# Patient Record
Sex: Male | Born: 1970 | Race: White | Hispanic: No | Marital: Married
Health system: Southern US, Community
[De-identification: ages and names within clinical notes are randomized; demographics above are authoritative.]

---

## 2014-04-19 ENCOUNTER — Ambulatory Visit
Admission: RE | Admit: 2014-04-19 | Discharge: 2014-04-19 | Disposition: A | Discharge status: Home / Self Care | Payer: 59 | Source: Ambulatory Visit | Attending: Family Medicine | Admitting: Family Medicine

## 2014-04-19 ENCOUNTER — Other Ambulatory Visit: Payer: Self-pay | Admitting: Family Medicine

## 2014-04-19 DIAGNOSIS — M25529 Pain in unspecified elbow: Secondary | ICD-10-CM

## 2017-04-08 DIAGNOSIS — H6123 Impacted cerumen, bilateral: Secondary | ICD-10-CM | POA: Diagnosis not present

## 2017-04-08 DIAGNOSIS — I10 Essential (primary) hypertension: Secondary | ICD-10-CM | POA: Diagnosis not present

## 2017-10-23 DIAGNOSIS — I10 Essential (primary) hypertension: Secondary | ICD-10-CM | POA: Diagnosis not present

## 2017-10-23 DIAGNOSIS — H6123 Impacted cerumen, bilateral: Secondary | ICD-10-CM | POA: Diagnosis not present

## 2017-10-23 DIAGNOSIS — Z23 Encounter for immunization: Secondary | ICD-10-CM | POA: Diagnosis not present

## 2017-10-23 DIAGNOSIS — Z0000 Encounter for general adult medical examination without abnormal findings: Secondary | ICD-10-CM | POA: Diagnosis not present

## 2018-04-23 DIAGNOSIS — H6123 Impacted cerumen, bilateral: Secondary | ICD-10-CM | POA: Diagnosis not present

## 2018-04-23 DIAGNOSIS — I10 Essential (primary) hypertension: Secondary | ICD-10-CM | POA: Diagnosis not present

## 2018-06-02 DIAGNOSIS — L814 Other melanin hyperpigmentation: Secondary | ICD-10-CM | POA: Diagnosis not present

## 2018-06-02 DIAGNOSIS — D225 Melanocytic nevi of trunk: Secondary | ICD-10-CM | POA: Diagnosis not present

## 2018-06-02 DIAGNOSIS — L738 Other specified follicular disorders: Secondary | ICD-10-CM | POA: Diagnosis not present

## 2018-10-23 DIAGNOSIS — Z23 Encounter for immunization: Secondary | ICD-10-CM | POA: Diagnosis not present

## 2018-10-23 DIAGNOSIS — I10 Essential (primary) hypertension: Secondary | ICD-10-CM | POA: Diagnosis not present

## 2018-10-23 DIAGNOSIS — Z0000 Encounter for general adult medical examination without abnormal findings: Secondary | ICD-10-CM | POA: Diagnosis not present

## 2019-05-11 DIAGNOSIS — H6123 Impacted cerumen, bilateral: Secondary | ICD-10-CM | POA: Diagnosis not present

## 2019-05-11 DIAGNOSIS — I10 Essential (primary) hypertension: Secondary | ICD-10-CM | POA: Diagnosis not present

## 2019-10-28 DIAGNOSIS — Z0000 Encounter for general adult medical examination without abnormal findings: Secondary | ICD-10-CM | POA: Diagnosis not present

## 2019-10-28 DIAGNOSIS — I10 Essential (primary) hypertension: Secondary | ICD-10-CM | POA: Diagnosis not present

## 2019-10-28 DIAGNOSIS — Z125 Encounter for screening for malignant neoplasm of prostate: Secondary | ICD-10-CM | POA: Diagnosis not present

## 2019-10-28 DIAGNOSIS — Z23 Encounter for immunization: Secondary | ICD-10-CM | POA: Diagnosis not present

## 2019-12-07 DIAGNOSIS — S91311A Laceration without foreign body, right foot, initial encounter: Secondary | ICD-10-CM | POA: Diagnosis not present

## 2019-12-28 DIAGNOSIS — S91111A Laceration without foreign body of right great toe without damage to nail, initial encounter: Secondary | ICD-10-CM | POA: Diagnosis not present

## 2020-04-27 DIAGNOSIS — I10 Essential (primary) hypertension: Secondary | ICD-10-CM | POA: Diagnosis not present

## 2020-04-27 DIAGNOSIS — H6123 Impacted cerumen, bilateral: Secondary | ICD-10-CM | POA: Diagnosis not present

## 2020-10-31 DIAGNOSIS — Z125 Encounter for screening for malignant neoplasm of prostate: Secondary | ICD-10-CM | POA: Diagnosis not present

## 2020-10-31 DIAGNOSIS — Z23 Encounter for immunization: Secondary | ICD-10-CM | POA: Diagnosis not present

## 2020-10-31 DIAGNOSIS — I10 Essential (primary) hypertension: Secondary | ICD-10-CM | POA: Diagnosis not present

## 2020-10-31 DIAGNOSIS — Z0000 Encounter for general adult medical examination without abnormal findings: Secondary | ICD-10-CM | POA: Diagnosis not present

## 2020-10-31 DIAGNOSIS — Z13220 Encounter for screening for lipoid disorders: Secondary | ICD-10-CM | POA: Diagnosis not present

## 2020-12-06 DIAGNOSIS — E669 Obesity, unspecified: Secondary | ICD-10-CM | POA: Diagnosis not present

## 2020-12-06 DIAGNOSIS — R748 Abnormal levels of other serum enzymes: Secondary | ICD-10-CM | POA: Diagnosis not present

## 2020-12-06 DIAGNOSIS — Z1211 Encounter for screening for malignant neoplasm of colon: Secondary | ICD-10-CM | POA: Diagnosis not present

## 2021-01-27 DIAGNOSIS — D123 Benign neoplasm of transverse colon: Secondary | ICD-10-CM | POA: Diagnosis not present

## 2021-01-27 DIAGNOSIS — K635 Polyp of colon: Secondary | ICD-10-CM | POA: Diagnosis not present

## 2021-01-27 DIAGNOSIS — Z1211 Encounter for screening for malignant neoplasm of colon: Secondary | ICD-10-CM | POA: Diagnosis not present

## 2021-05-11 DIAGNOSIS — H6123 Impacted cerumen, bilateral: Secondary | ICD-10-CM | POA: Diagnosis not present

## 2021-05-11 DIAGNOSIS — R945 Abnormal results of liver function studies: Secondary | ICD-10-CM | POA: Diagnosis not present

## 2021-05-11 DIAGNOSIS — I10 Essential (primary) hypertension: Secondary | ICD-10-CM | POA: Diagnosis not present

## 2021-05-18 ENCOUNTER — Other Ambulatory Visit: Payer: Self-pay

## 2021-05-18 ENCOUNTER — Ambulatory Visit: Payer: BC Managed Care – PPO | Admitting: Podiatry

## 2021-05-18 ENCOUNTER — Encounter: Payer: Self-pay | Admitting: Podiatry

## 2021-05-18 DIAGNOSIS — B070 Plantar wart: Secondary | ICD-10-CM

## 2021-05-18 NOTE — Progress Notes (Signed)
Subjective:   Patient ID: George Hanson, male   DOB: 51 y.o.   MRN: [id number]   HPI Patient presents stating he is got a lesion underneath his right foot that is been there for fairly long time and concerned about 1 on the left.  States he thinks it is a plantars wart and did have history when he was a child.  Patient does not smoke likes to be active   Review of Systems  All other systems reviewed and are negative.       Objective:  Physical Exam Vitals and nursing note reviewed.  Constitutional:      Appearance: He is well-developed.  Pulmonary:     Effort: Pulmonary effort is normal.  Musculoskeletal:        General: Normal range of motion.  Skin:    General: Skin is warm.  Neurological:     Mental Status: He is alert.     Neurovascular status intact muscle strength adequate range of motion adequate patient found to have keratotic lesions of first metatarsal shaft right that upon debridement shows pinpoint bleeding.  Patient is found to have small 1 on the left but does not appear to be the same consistency as the right 1 and the right one measures approximately 4 x 4 millimeter     Assessment:  Probability for verruca plantaris plantar right     Plan:  H&P reviewed condition debrided the lesion saw small amount of pinpoint bleeding consistent with wart and applied chemical to create immune response with sterile dressing.  Reappoint to recheck again and may require excision but I like to avoid that if possible

## 2021-08-17 DIAGNOSIS — M1712 Unilateral primary osteoarthritis, left knee: Secondary | ICD-10-CM | POA: Diagnosis not present

## 2021-11-17 DIAGNOSIS — R945 Abnormal results of liver function studies: Secondary | ICD-10-CM | POA: Diagnosis not present

## 2021-11-17 DIAGNOSIS — Z125 Encounter for screening for malignant neoplasm of prostate: Secondary | ICD-10-CM | POA: Diagnosis not present

## 2021-11-17 DIAGNOSIS — Z0000 Encounter for general adult medical examination without abnormal findings: Secondary | ICD-10-CM | POA: Diagnosis not present

## 2021-11-17 DIAGNOSIS — I10 Essential (primary) hypertension: Secondary | ICD-10-CM | POA: Diagnosis not present

## 2021-11-17 DIAGNOSIS — R7989 Other specified abnormal findings of blood chemistry: Secondary | ICD-10-CM | POA: Diagnosis not present

## 2021-11-17 DIAGNOSIS — Z13220 Encounter for screening for lipoid disorders: Secondary | ICD-10-CM | POA: Diagnosis not present

## 2021-11-17 DIAGNOSIS — Z23 Encounter for immunization: Secondary | ICD-10-CM | POA: Diagnosis not present

## 2021-11-29 ENCOUNTER — Other Ambulatory Visit: Payer: Self-pay | Admitting: Family Medicine

## 2021-11-29 DIAGNOSIS — R7989 Other specified abnormal findings of blood chemistry: Secondary | ICD-10-CM

## 2022-04-18 DIAGNOSIS — R208 Other disturbances of skin sensation: Secondary | ICD-10-CM | POA: Diagnosis not present

## 2022-04-18 DIAGNOSIS — M5430 Sciatica, unspecified side: Secondary | ICD-10-CM | POA: Diagnosis not present

## 2022-04-24 ENCOUNTER — Other Ambulatory Visit: Payer: Self-pay | Admitting: Family Medicine

## 2022-04-24 DIAGNOSIS — M5430 Sciatica, unspecified side: Secondary | ICD-10-CM

## 2022-05-04 ENCOUNTER — Ambulatory Visit
Admission: RE | Admit: 2022-05-04 | Discharge: 2022-05-04 | Disposition: A | Discharge status: Home / Self Care | Payer: BC Managed Care – PPO | Source: Ambulatory Visit | Attending: Family Medicine | Admitting: Family Medicine

## 2022-05-04 DIAGNOSIS — M5430 Sciatica, unspecified side: Secondary | ICD-10-CM

## 2022-05-04 DIAGNOSIS — M545 Low back pain, unspecified: Secondary | ICD-10-CM | POA: Diagnosis not present

## 2022-05-04 DIAGNOSIS — R200 Anesthesia of skin: Secondary | ICD-10-CM | POA: Diagnosis not present

## 2022-05-04 DIAGNOSIS — R202 Paresthesia of skin: Secondary | ICD-10-CM | POA: Diagnosis not present

## 2022-05-18 DIAGNOSIS — R208 Other disturbances of skin sensation: Secondary | ICD-10-CM | POA: Diagnosis not present

## 2022-05-18 DIAGNOSIS — R945 Abnormal results of liver function studies: Secondary | ICD-10-CM | POA: Diagnosis not present

## 2022-05-18 DIAGNOSIS — H6123 Impacted cerumen, bilateral: Secondary | ICD-10-CM | POA: Diagnosis not present

## 2022-05-18 DIAGNOSIS — I10 Essential (primary) hypertension: Secondary | ICD-10-CM | POA: Diagnosis not present

## 2022-05-25 DIAGNOSIS — M5126 Other intervertebral disc displacement, lumbar region: Secondary | ICD-10-CM | POA: Diagnosis not present

## 2022-05-25 DIAGNOSIS — Z6830 Body mass index (BMI) 30.0-30.9, adult: Secondary | ICD-10-CM | POA: Diagnosis not present

## 2022-07-11 DIAGNOSIS — Z6830 Body mass index (BMI) 30.0-30.9, adult: Secondary | ICD-10-CM | POA: Diagnosis not present

## 2022-07-11 DIAGNOSIS — M5126 Other intervertebral disc displacement, lumbar region: Secondary | ICD-10-CM | POA: Diagnosis not present

## 2022-08-12 IMAGING — MR MR LUMBAR SPINE W/O CM
4 of 5 series · 26 of 48 positions shown · non-contrast
Comparison: None Available.

CLINICAL DATA: Low back pain with numbness and tingling in left leg
5 months ago related to painting the ceiling at his house.

EXAM:
MRI LUMBAR SPINE WITHOUT CONTRAST
TECHNIQUE: Multiplanar, multisequence MR imaging of the lumbar spine was
performed. No intravenous contrast was administered.

[Series 3: T2 · sagittal · 4.0mm · 0.59mm/px · 6 of 17 slices shown (1 of 2)]
[im 1/17]
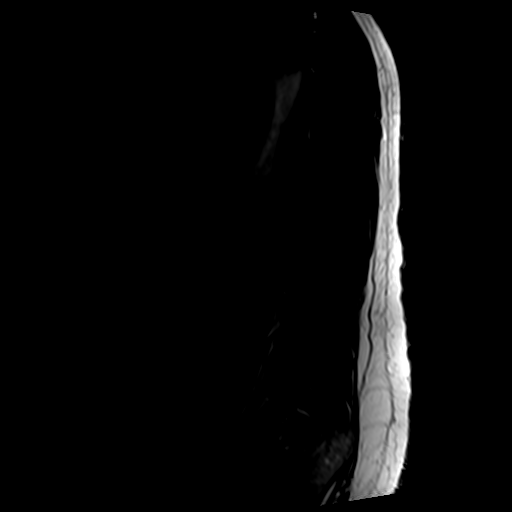
[im 4/17]
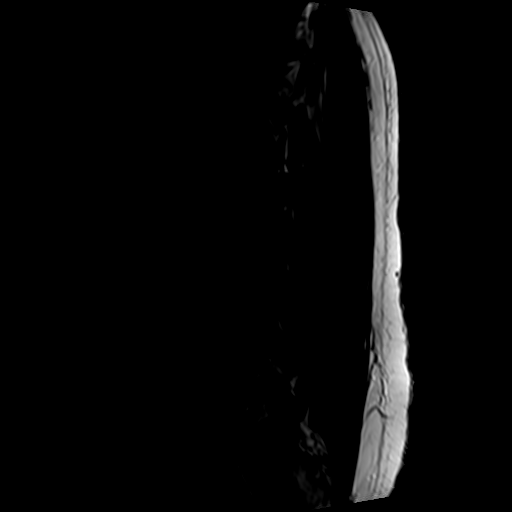
[im 7/17]
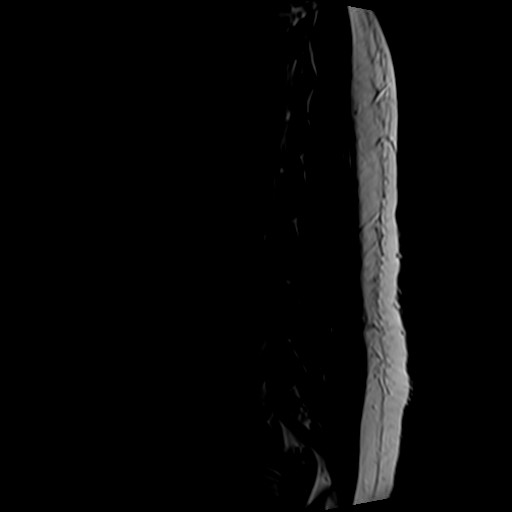
[im 10/17]
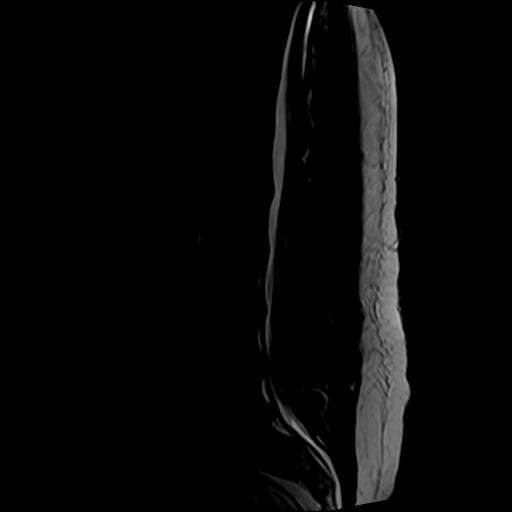
[im 13/17]
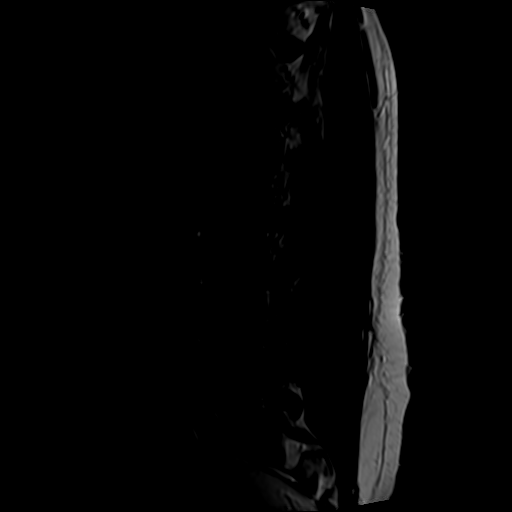
[im 17/17]
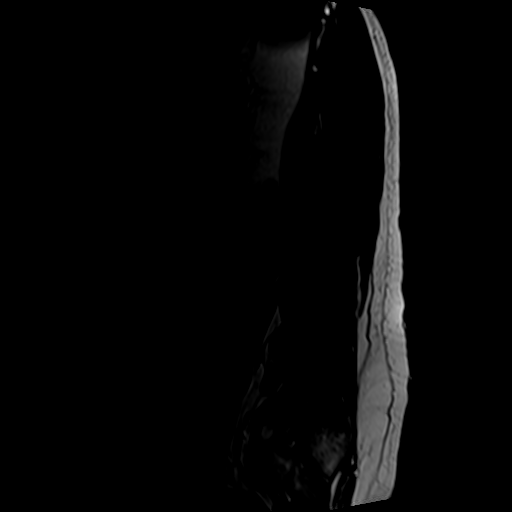

[Series 4: T1 · sagittal · 4.0mm · 0.59mm/px · 5 of 17 slices shown (1 of 2)]
[im 1/17]
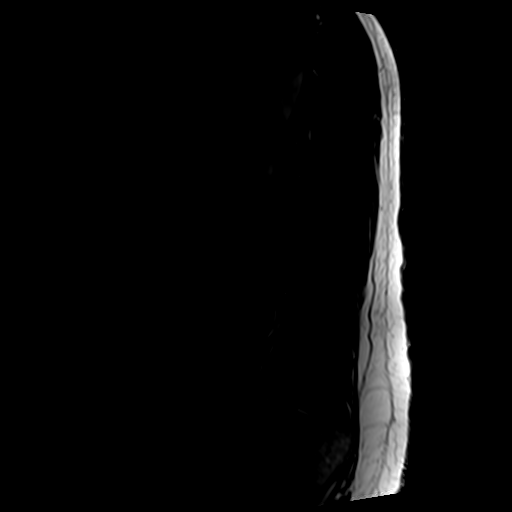
[im 5/17]
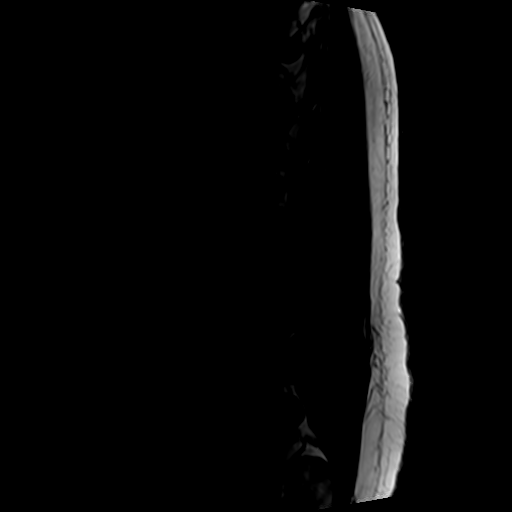
[im 9/17]
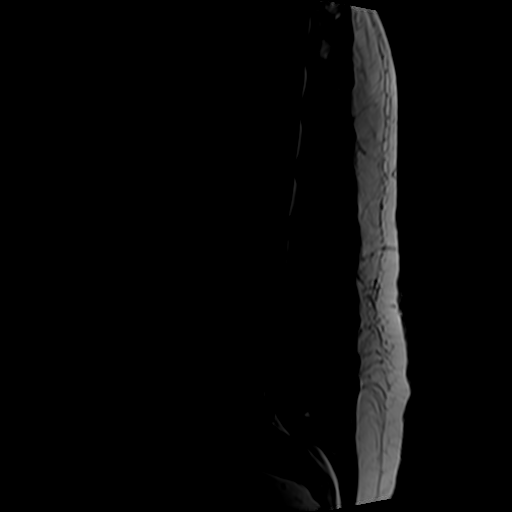
[im 13/17]
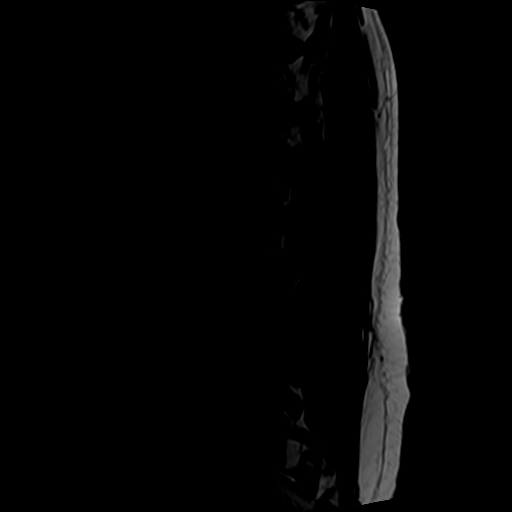
[im 17/17]
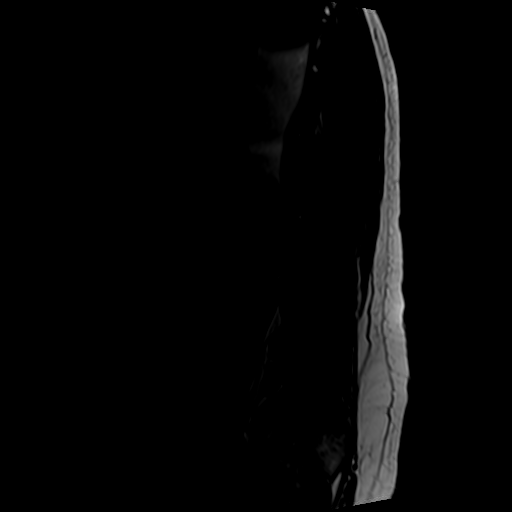

[Series 6: T2 · axial · 4.0mm · 0.78mm/px · z∈[-124,+130]mm · 10 of 52 slices shown (2 of 2)]
[im 4/52]
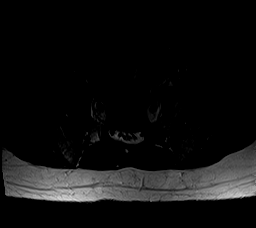
[im 7/52]
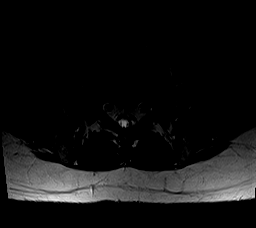
[im 11/52]
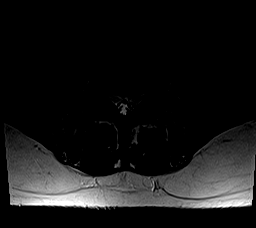
[im 18/52]
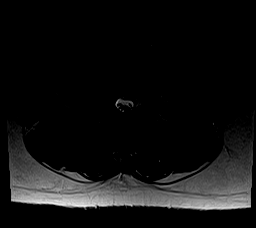
[im 24/52]
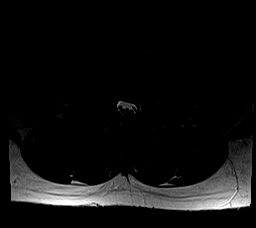
[im 28/52]
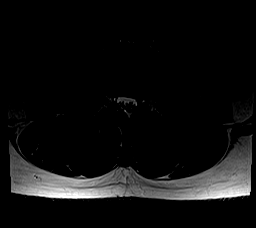
[im 31/52]
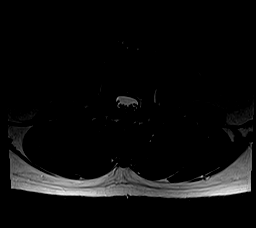
[im 38/52]
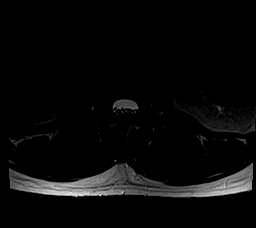
[im 45/52]
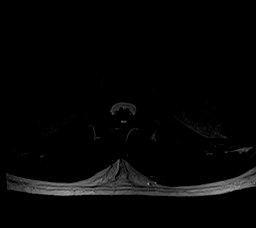
[im 52/52]
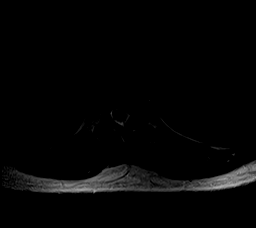

[Series 7: T1 · axial · 4.0mm · 0.39mm/px · z∈[-124,+94]mm · 5 of 52 slices shown (2 of 2)]
[im 4/52]
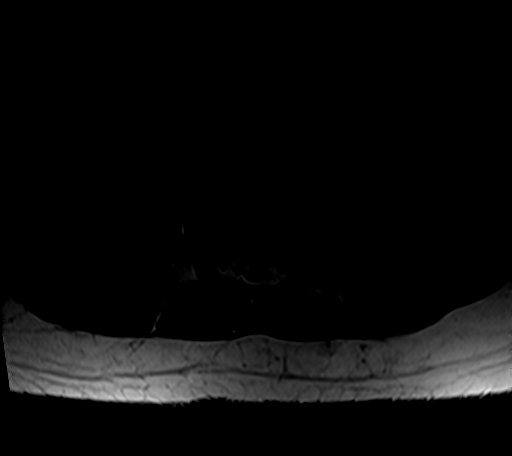
[im 7/52]
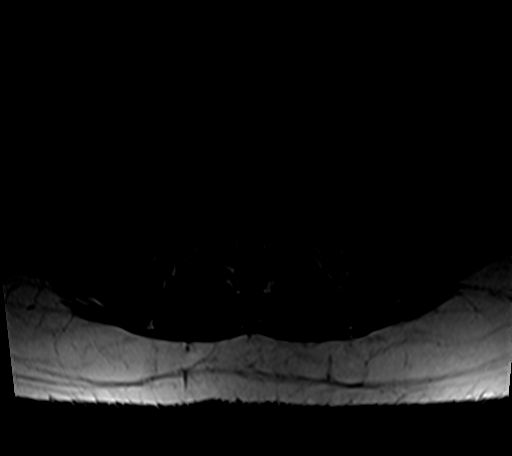
[im 11/52]
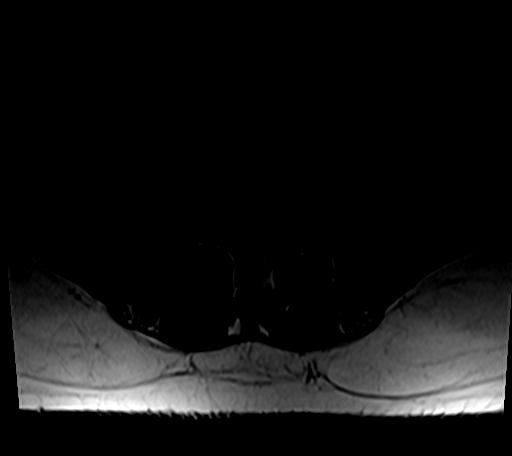
[im 28/52]
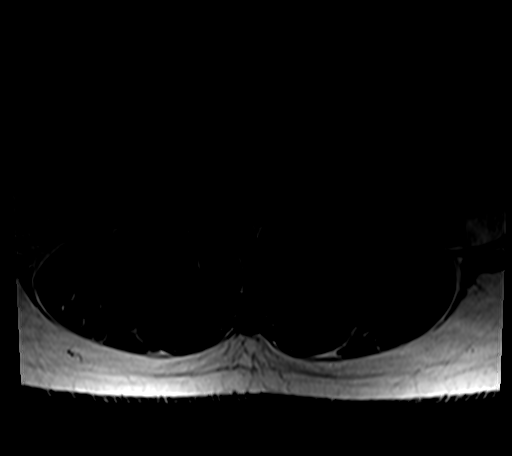
[im 45/52]
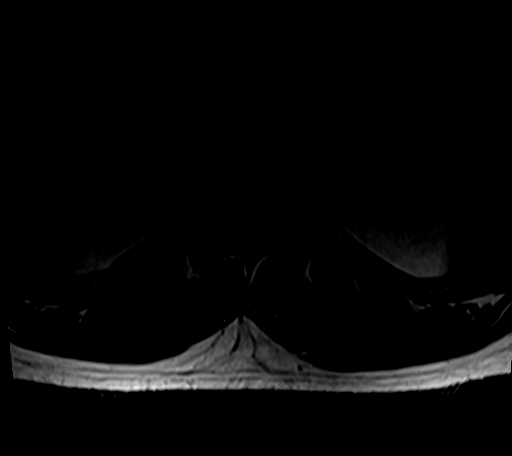

[26 of 48 positions shown; findings below may reference images not displayed]

FINDINGS: Segmentation:  Standard.

Alignment:  Physiologic.

Vertebrae: No acute fracture, evidence of discitis, or aggressive
bone lesion.

Conus medullaris and cauda equina: Conus extends to the T12-L1
level. Conus and cauda equina appear normal.

Paraspinal and other soft tissues: No acute paraspinal abnormality.

Disc levels:

Disc spaces: Degenerative disease with disc height loss at L1-2,
L2-3, L3-4, L4-5 and L5-S1 with reactive endplate changes at L3-4
L5-S1.

T12-L1: No significant disc bulge. No neural foraminal stenosis. No
central canal stenosis.

L1-L2: Mild broad-based disc bulge with a small left paracentral
annular fissure. No foraminal or central canal stenosis.

L2-L3: Broad-based disc bulge with a small central annular fissure.
No foraminal or central canal stenosis.

L3-L4: Broad-based disc bulge flattening the ventral thecal sac.
Mild bilateral facet arthropathy. Mild spinal stenosis. No foraminal
stenosis.

L4-L5: Broad-based disc bulge with a left foraminal/lateral broad
disc protrusion in close proximity to the exiting left L4 nerve
root. Left subarticular recess stenosis potentially impinging upon
the left L5 nerve root. Moderate spinal stenosis. Mild bilateral
facet arthropathy. No right foraminal stenosis. Mild left foraminal
stenosis.

L5-S1: Broad-based disc bulge with a left paracentral disc
protrusion contacting the left intraspinal S1 nerve root. No
foraminal or central canal stenosis.
IMPRESSION: 1. At L4-5 there is a broad-based disc bulge with a left
foraminal/lateral broad disc protrusion in close proximity to the
exiting left L4 nerve root. Left subarticular recess stenosis
potentially impinging upon the left L5 nerve root. Moderate spinal
stenosis. Mild bilateral facet arthropathy. No right foraminal
stenosis. Mild left foraminal stenosis.
2. At L5-S1 there is a broad-based disc bulge with a left
paracentral disc protrusion contacting the left intraspinal S1 nerve
root.

## 2022-11-20 DIAGNOSIS — H6123 Impacted cerumen, bilateral: Secondary | ICD-10-CM | POA: Diagnosis not present

## 2022-11-20 DIAGNOSIS — I10 Essential (primary) hypertension: Secondary | ICD-10-CM | POA: Diagnosis not present

## 2022-11-20 DIAGNOSIS — Z125 Encounter for screening for malignant neoplasm of prostate: Secondary | ICD-10-CM | POA: Diagnosis not present

## 2022-11-20 DIAGNOSIS — Z13220 Encounter for screening for lipoid disorders: Secondary | ICD-10-CM | POA: Diagnosis not present

## 2022-11-20 DIAGNOSIS — Z0000 Encounter for general adult medical examination without abnormal findings: Secondary | ICD-10-CM | POA: Diagnosis not present

## 2023-06-03 DIAGNOSIS — G479 Sleep disorder, unspecified: Secondary | ICD-10-CM | POA: Diagnosis not present

## 2023-06-03 DIAGNOSIS — M549 Dorsalgia, unspecified: Secondary | ICD-10-CM | POA: Diagnosis not present

## 2023-06-03 DIAGNOSIS — H6123 Impacted cerumen, bilateral: Secondary | ICD-10-CM | POA: Diagnosis not present

## 2023-06-03 DIAGNOSIS — I10 Essential (primary) hypertension: Secondary | ICD-10-CM | POA: Diagnosis not present

## 2024-01-08 DIAGNOSIS — I10 Essential (primary) hypertension: Secondary | ICD-10-CM | POA: Diagnosis not present

## 2024-01-08 DIAGNOSIS — H6123 Impacted cerumen, bilateral: Secondary | ICD-10-CM | POA: Diagnosis not present

## 2024-01-08 DIAGNOSIS — Z23 Encounter for immunization: Secondary | ICD-10-CM | POA: Diagnosis not present

## 2024-01-27 ENCOUNTER — Encounter: Payer: Self-pay | Admitting: Podiatry

## 2024-01-27 ENCOUNTER — Ambulatory Visit: Payer: BC Managed Care – PPO | Admitting: Podiatry

## 2024-01-27 DIAGNOSIS — B070 Plantar wart: Secondary | ICD-10-CM | POA: Diagnosis not present

## 2024-01-27 MED ORDER — FLUOROURACIL 5 % EX CREA: TOPICAL_CREAM | Freq: Two times a day (BID) | CUTANEOUS | 2 refills | Status: AC

## 2024-01-29 NOTE — Progress Notes (Signed)
Subjective:   Patient ID: George Hanson, male   DOB: 54 y.o.   MRN: [id number]   HPI Patient presents with painful lesion plantar aspect right first metatarsal that has shown up recently and is sore and hard to walk   ROS      Objective:  Physical Exam  Neurovascular status intact lesion plantar first metatarsal head right measuring about 7 x 7 mm painful to lateral pressure     Assessment:  Verruca plantaris plantar aspect right     Plan:  H&P reviewed debrided the lesion applied chemical agent to create immune response with sterile dressing explained what to do if blistering were to occur and we will start home Efudex with prescription written today

## 2024-03-09 DIAGNOSIS — Z23 Encounter for immunization: Secondary | ICD-10-CM | POA: Diagnosis not present

## 2024-04-12 DIAGNOSIS — M5430 Sciatica, unspecified side: Secondary | ICD-10-CM | POA: Diagnosis not present

## 2024-04-27 DIAGNOSIS — Z6831 Body mass index (BMI) 31.0-31.9, adult: Secondary | ICD-10-CM | POA: Diagnosis not present

## 2024-04-27 DIAGNOSIS — M4726 Other spondylosis with radiculopathy, lumbar region: Secondary | ICD-10-CM | POA: Diagnosis not present

## 2024-04-27 DIAGNOSIS — M5126 Other intervertebral disc displacement, lumbar region: Secondary | ICD-10-CM | POA: Diagnosis not present

## 2024-05-04 DIAGNOSIS — M47817 Spondylosis without myelopathy or radiculopathy, lumbosacral region: Secondary | ICD-10-CM | POA: Diagnosis not present

## 2024-05-04 DIAGNOSIS — M47816 Spondylosis without myelopathy or radiculopathy, lumbar region: Secondary | ICD-10-CM | POA: Diagnosis not present

## 2024-05-06 DIAGNOSIS — M5416 Radiculopathy, lumbar region: Secondary | ICD-10-CM | POA: Diagnosis not present

## 2024-05-08 DIAGNOSIS — M5416 Radiculopathy, lumbar region: Secondary | ICD-10-CM | POA: Diagnosis not present

## 2024-05-12 DIAGNOSIS — M5416 Radiculopathy, lumbar region: Secondary | ICD-10-CM | POA: Diagnosis not present

## 2024-05-14 DIAGNOSIS — M5416 Radiculopathy, lumbar region: Secondary | ICD-10-CM | POA: Diagnosis not present

## 2024-05-26 DIAGNOSIS — M5416 Radiculopathy, lumbar region: Secondary | ICD-10-CM | POA: Diagnosis not present

## 2024-05-28 DIAGNOSIS — M5416 Radiculopathy, lumbar region: Secondary | ICD-10-CM | POA: Diagnosis not present

## 2024-06-01 DIAGNOSIS — M4726 Other spondylosis with radiculopathy, lumbar region: Secondary | ICD-10-CM | POA: Diagnosis not present

## 2024-06-01 DIAGNOSIS — Z6831 Body mass index (BMI) 31.0-31.9, adult: Secondary | ICD-10-CM | POA: Diagnosis not present

## 2024-06-02 DIAGNOSIS — M5416 Radiculopathy, lumbar region: Secondary | ICD-10-CM | POA: Diagnosis not present

## 2024-06-04 DIAGNOSIS — M5416 Radiculopathy, lumbar region: Secondary | ICD-10-CM | POA: Diagnosis not present

## 2024-06-08 DIAGNOSIS — H6123 Impacted cerumen, bilateral: Secondary | ICD-10-CM | POA: Diagnosis not present

## 2024-06-08 DIAGNOSIS — I10 Essential (primary) hypertension: Secondary | ICD-10-CM | POA: Diagnosis not present

## 2024-06-09 DIAGNOSIS — M5416 Radiculopathy, lumbar region: Secondary | ICD-10-CM | POA: Diagnosis not present

## 2024-06-11 DIAGNOSIS — M5416 Radiculopathy, lumbar region: Secondary | ICD-10-CM | POA: Diagnosis not present

## 2024-06-18 DIAGNOSIS — M5416 Radiculopathy, lumbar region: Secondary | ICD-10-CM | POA: Diagnosis not present

## 2024-06-25 DIAGNOSIS — M5416 Radiculopathy, lumbar region: Secondary | ICD-10-CM | POA: Diagnosis not present

## 2024-08-17 DIAGNOSIS — I10 Essential (primary) hypertension: Secondary | ICD-10-CM | POA: Diagnosis not present

## 2024-08-17 DIAGNOSIS — Z125 Encounter for screening for malignant neoplasm of prostate: Secondary | ICD-10-CM | POA: Diagnosis not present

## 2024-08-17 DIAGNOSIS — M549 Dorsalgia, unspecified: Secondary | ICD-10-CM | POA: Diagnosis not present

## 2024-08-17 DIAGNOSIS — Z0000 Encounter for general adult medical examination without abnormal findings: Secondary | ICD-10-CM | POA: Diagnosis not present

## 2024-08-21 DIAGNOSIS — R7309 Other abnormal glucose: Secondary | ICD-10-CM | POA: Diagnosis not present

## 2024-08-21 DIAGNOSIS — R5383 Other fatigue: Secondary | ICD-10-CM | POA: Diagnosis not present

## 2024-08-21 DIAGNOSIS — I10 Essential (primary) hypertension: Secondary | ICD-10-CM | POA: Diagnosis not present

## 2024-08-21 DIAGNOSIS — Z0000 Encounter for general adult medical examination without abnormal findings: Secondary | ICD-10-CM | POA: Diagnosis not present

## 2024-08-21 DIAGNOSIS — Z125 Encounter for screening for malignant neoplasm of prostate: Secondary | ICD-10-CM | POA: Diagnosis not present
